# Patient Record
Sex: Female | Born: 1965 | Race: Black or African American | Hispanic: No | Marital: Single | Smoking: Current every day smoker
Health system: Southern US, Community
[De-identification: ages and names within clinical notes are randomized; demographics above are authoritative.]

## PROBLEM LIST (undated history)

## (undated) DIAGNOSIS — O009 Unspecified ectopic pregnancy without intrauterine pregnancy: Secondary | ICD-10-CM

## (undated) DIAGNOSIS — K219 Gastro-esophageal reflux disease without esophagitis: Secondary | ICD-10-CM

## (undated) HISTORY — PX: ECTOPIC PREGNANCY SURGERY: SHX613

---

## 2013-11-08 ENCOUNTER — Encounter (HOSPITAL_BASED_OUTPATIENT_CLINIC_OR_DEPARTMENT_OTHER): Payer: Self-pay | Admitting: Emergency Medicine

## 2013-11-08 ENCOUNTER — Emergency Department (HOSPITAL_BASED_OUTPATIENT_CLINIC_OR_DEPARTMENT_OTHER)
Admission: EM | Admit: 2013-11-08 | Discharge: 2013-11-09 | Disposition: A | Discharge status: Home / Self Care | Payer: No Typology Code available for payment source | Attending: Emergency Medicine | Admitting: Emergency Medicine

## 2013-11-08 DIAGNOSIS — H6690 Otitis media, unspecified, unspecified ear: Secondary | ICD-10-CM | POA: Insufficient documentation

## 2013-11-08 DIAGNOSIS — H109 Unspecified conjunctivitis: Secondary | ICD-10-CM | POA: Insufficient documentation

## 2013-11-08 DIAGNOSIS — H6691 Otitis media, unspecified, right ear: Secondary | ICD-10-CM

## 2013-11-08 DIAGNOSIS — F17200 Nicotine dependence, unspecified, uncomplicated: Secondary | ICD-10-CM | POA: Insufficient documentation

## 2013-11-08 HISTORY — DX: Unspecified ectopic pregnancy without intrauterine pregnancy: O00.90

## 2013-11-08 MED ORDER — TOBRAMYCIN 0.3 % OP SOLN: 2.0000 [drp] | OPHTHALMIC | Status: DC

## 2013-11-08 MED ORDER — AMOXICILLIN 500 MG PO CAPS: 500.0000 mg | ORAL_CAPSULE | Freq: Three times a day (TID) | ORAL | Status: AC

## 2013-11-08 NOTE — Discharge Instructions (Signed)
Conjunctivitis Conjunctivitis is commonly called "pink eye." Conjunctivitis can be caused by bacterial or viral infection, allergies, or injuries. There is usually redness of the lining of the eye, itching, discomfort, and sometimes discharge. There may be deposits of matter along the eyelids. A viral infection usually causes a watery discharge, while a bacterial infection causes a yellowish, thick discharge. Pink eye is very contagious and spreads by direct contact. You may be given antibiotic eyedrops as part of your treatment. Before using your eye medicine, remove all drainage from the eye by washing gently with warm water and cotton balls. Continue to use the medication until you have awakened 2 mornings in a row without discharge from the eye. Do not rub your eye. This increases the irritation and helps spread infection. Use separate towels from other household members. Wash your hands with soap and water before and after touching your eyes. Use cold compresses to reduce pain and sunglasses to relieve irritation from light. Do not wear contact lenses or wear eye makeup until the infection is gone. SEEK MEDICAL CARE IF:   Your symptoms are not better after 3 days of treatment.  You have increased pain or trouble seeing.  The outer eyelids become very red or swollen. Document Released: 06/29/2004 Document Revised: 08/14/2011 Document Reviewed: 05/22/2005 [facility] Patient Information 2014 [place], [place]. Otitis Media, Adult Otitis media is redness, soreness, and swelling (inflammation) of the middle ear. Otitis media may be caused by allergies or, most commonly, by infection. Often it occurs as a complication of the common cold. SIGNS AND SYMPTOMS Symptoms of otitis media may include:  Earache.  Fever.  Ringing in your ear.  Headache.  Leakage of fluid from the ear. DIAGNOSIS To diagnose otitis media, your health care provider will examine your ear with an otoscope. This is an  instrument that allows your health care provider to see into your ear in order to examine your eardrum. Your health care provider also will ask you questions about your symptoms. TREATMENT  Typically, otitis media resolves on its own within 3 5 days. Your health care provider may prescribe medicine to ease your symptoms of pain. If otitis media does not resolve within 5 days or is recurrent, your health care provider may prescribe antibiotic medicines if he or she suspects that a bacterial infection is the cause. HOME CARE INSTRUCTIONS   Take your medicine as directed until it is gone, even if you feel better after the first few days.  Only take over-the-counter or prescription medicines for pain, discomfort, or fever as directed by your health care provider.  Follow up with your health care provider as directed. SEEK MEDICAL CARE IF:  You have otitis media only in one ear or bleeding from your nose or both.  You notice a lump on your neck.  You are not getting better in 3 5 days.  You feel worse instead of better. SEEK IMMEDIATE MEDICAL CARE IF:   You have pain that is not controlled with medicine.  You have swelling, redness, or pain around your ear or stiffness in your neck.  You notice that part of your face is paralyzed.  You notice that the bone behind your ear (mastoid) is tender when you touch it. MAKE SURE YOU:   Understand these instructions.  Will watch your condition.  Will get help right away if you are not doing well or get worse. Document Released: 02/25/2004 Document Revised: 03/12/2013 Document Reviewed: 12/17/2012 [facility] Patient Information 2014 [place], [place].

## 2013-11-08 NOTE — ED Provider Notes (Signed)
CSN: [id number]     Arrival date & time 11/08/13  2309 History   First MD Initiated Contact with Patient 11/08/13 2349     Chief Complaint  Patient presents with  . Eye Drainage     (Consider location/radiation/quality/duration/timing/severity/associated sxs/prior Treatment) Patient is a 48 y.o. female presenting with conjunctivitis. The history is provided by the patient. No language interpreter was used.  Conjunctivitis This is a new problem. Episode onset: 3 days. The problem occurs constantly. The problem has been unchanged. Pertinent negatives include no fever. Nothing aggravates the symptoms. She has tried nothing for the symptoms. The treatment provided no relief.    Past Medical History  Diagnosis Date  . Ectopic pregnancy    History reviewed. No pertinent past surgical history. No family history on file. History  Substance Use Topics  . Smoking status: Current Every Day Smoker  . Smokeless tobacco: Not on file  . Alcohol Use: Yes     Comment: occasdional    OB History   Grav Para Term Preterm Abortions TAB SAB Ect Mult Living                 Review of Systems  Constitutional: Negative for fever.  HENT: Positive for ear pain.   Eyes: Positive for pain and redness.  All other systems reviewed and are negative.     Allergies  Review of patient's allergies indicates no known allergies.  Home Medications   Prior to Admission medications   Not on File   BP 155/94  Pulse 91  Temp(Src) 98.1 F (36.7 C)  Resp 18  Ht 5\' 8"  (1.727 m)  Wt 253 lb (114.76 kg)  BMI 38.48 kg/m2  SpO2 97%  LMP 10/31/2013 Physical Exam  Nursing note and vitals reviewed. Constitutional: She appears well-developed and well-nourished.  HENT:  Head: Normocephalic.  Nose: Nose normal.  Mouth/Throat: Oropharynx is clear and moist.  Right tm erythematous  Eyes: Pupils are equal, round, and reactive to light. Right eye exhibits discharge.  Neck: Normal range of motion.   Cardiovascular: Normal rate and normal heart sounds.   Pulmonary/Chest: Effort normal.  Abdominal: Soft.  Musculoskeletal: Normal range of motion.  Neurological: She is alert.  Skin: Skin is warm.    ED Course  Procedures (including critical care time) Labs Review Labs Reviewed - No data to display  Imaging Review No results found.   EKG Interpretation None      MDM   Final diagnoses:  Otitis media of right ear  Conjunctivitis    tobrex amoxicillian    Elson Areas, PA-C 11/09/13 0000  Elson Areas, PA-C 11/09/13 0000

## 2013-11-08 NOTE — ED Notes (Signed)
Pt with redness to both eyes that started on Thursday. States right is worse than left. States eyes were "matted shut" this morning. Also c/o right ear hurting. Denies any fevers. Both eyes with redness on exam.

## 2013-11-09 NOTE — ED Provider Notes (Signed)
Medical screening examination/treatment/procedure(s) were performed by non-physician practitioner and as supervising physician I was immediately available for consultation/collaboration.   EKG Interpretation None        Hanley Seamen, MD 11/09/13 [phone]

## 2013-11-09 NOTE — ED Notes (Signed)
rx x 2 for tobrex gtts and amoxicillin

## 2016-03-20 ENCOUNTER — Encounter (HOSPITAL_BASED_OUTPATIENT_CLINIC_OR_DEPARTMENT_OTHER): Payer: Self-pay

## 2016-03-20 ENCOUNTER — Emergency Department (HOSPITAL_BASED_OUTPATIENT_CLINIC_OR_DEPARTMENT_OTHER)
Admission: EM | Admit: 2016-03-20 | Discharge: 2016-03-20 | Disposition: A | Discharge status: Home / Self Care | Payer: No Typology Code available for payment source | Attending: Emergency Medicine | Admitting: Emergency Medicine

## 2016-03-20 DIAGNOSIS — F17210 Nicotine dependence, cigarettes, uncomplicated: Secondary | ICD-10-CM | POA: Insufficient documentation

## 2016-03-20 DIAGNOSIS — K047 Periapical abscess without sinus: Secondary | ICD-10-CM | POA: Insufficient documentation

## 2016-03-20 MED ORDER — NAPROXEN 500 MG PO TABS: 500.0000 mg | ORAL_TABLET | Freq: Two times a day (BID) | ORAL | 0 refills | Status: DC

## 2016-03-20 MED ORDER — PENICILLIN V POTASSIUM 500 MG PO TABS: 500.0000 mg | ORAL_TABLET | Freq: Four times a day (QID) | ORAL | 0 refills | Status: DC

## 2016-03-20 NOTE — ED Provider Notes (Signed)
[facility] Provider Note   CSN: [id number] Arrival date & time: 03/20/16  1532  By signing my name below, I, Ashley Potter, attest that this documentation has been prepared under the direction and in the presence of Sharilyn Sites, PA-C. Electronically Signed: Linna Potter, Scribe. 03/20/2016. 4:11 PM.  History   Chief Complaint Chief Complaint  Patient presents with  . Abscess    The history is provided by the patient. No language interpreter was used.     HPI Comments: Ashley Potter is a 50 y.o. female who presents to the Emergency Department complaining of sudden onset, constant, left upper dental abscess beginning 3 days ago. Pt reports the abscess is painful and has gotten bigger since onset. She notes associated swelling and is concerned for infection. Pt endorses pain exacerbation with swallowing. She denies experiencing similar symptoms in the past. Pt further denies fever, chills, trouble swallowing, or any other associated symptoms. Pt does not have a dentist.  Past Medical History:  Diagnosis Date  . Ectopic pregnancy     There are no active problems to display for this patient.   Past Surgical History:  Procedure Laterality Date  . ECTOPIC PREGNANCY SURGERY      OB History    No data available       Home Medications    Prior to Admission medications   Medication Sig Start Date End Date Taking? Authorizing Provider  naproxen (NAPROSYN) 500 MG tablet Take 1 tablet (500 mg total) by mouth 2 (two) times daily with a meal. 03/20/16   Garlon Hatchet, PA-C  penicillin v potassium (VEETID) 500 MG tablet Take 1 tablet (500 mg total) by mouth 4 (four) times daily. 03/20/16   Garlon Hatchet, PA-C    Family History No family history on file.  Social History Social History  Substance Use Topics  . Smoking status: Current Every Day Smoker    Types: Cigarettes  . Smokeless tobacco: Never Used  . Alcohol use Yes     Comment: weekly      Allergies   Review of patient's allergies indicates no known allergies.   Review of Systems Review of Systems  Constitutional: Negative for chills and fever.  HENT: Positive for dental problem (left upper dental abscess). Negative for facial swelling and trouble swallowing.   All other systems reviewed and are negative.   Physical Exam Updated Vital Signs BP 141/86 (BP Location: Left Arm)   Pulse 96   Temp 98.6 F (37 C) (Oral)   Resp 18   Ht 5\' 7"  (1.702 m)   Wt 234 lb (106.1 kg)   SpO2 98%   BMI 36.65 kg/m   Physical Exam  Constitutional: She is oriented to person, place, and time. She appears well-developed and well-nourished.  HENT:  Head: Normocephalic and atraumatic.  Mouth/Throat: Oropharynx is clear and moist.  Teeth largely in fair dentition, left upper first molar with cavity present, surrounding gingiva swollen and appears irritated, handling secretions appropriately, no trismus, no facial or neck swelling, normal phonation without stridor  Eyes: Conjunctivae and EOM are normal. Pupils are equal, round, and reactive to light.  Neck: Normal range of motion.  Cardiovascular: Normal rate, regular rhythm and normal heart sounds.   Pulmonary/Chest: Effort normal and breath sounds normal.  Abdominal: Soft. Bowel sounds are normal.  Musculoskeletal: Normal range of motion.  Neurological: She is alert and oriented to person, place, and time.  Skin: Skin is warm and dry.  Psychiatric: She has a  normal mood and affect.  Nursing note and vitals reviewed.   ED Treatments / Results  Labs (all labs ordered are listed, but only abnormal results are displayed) Labs Reviewed - No data to display  EKG  EKG Interpretation None       Radiology No results found.  Procedures Procedures (including critical care time)  DIAGNOSTIC STUDIES: Oxygen Saturation is 98% on RA, normal by my interpretation.    COORDINATION OF CARE: 4:13 PM Discussed treatment plan  with pt at bedside and pt agreed to plan.  Medications Ordered in ED Medications - No data to display   Initial Impression / Assessment and Plan / ED Course  I have reviewed the triage vital signs and the nursing notes.  Pertinent labs & imaging results that were available during my care of the patient were reviewed by me and considered in my medical decision making (see chart for details).  Clinical Course   50 y.o. F here with left upper dental pain.  She is afebrile, non-toxic.  Left upper gums swollen surrounding the left upper first molar. There is no drainable fluid collection currently. No facial or neck swelling. Handling secretions well. Not clinically concerning for Ludwig's angina. Likely developing abscess. Will discharge home on antibiotics, dental follow-up recommended.  Discussed plan with patient, she acknowledged understanding and agreed with plan of care.  Return precautions given for new or worsening symptoms.  I personally performed the services described in this documentation, which was scribed in my presence. The recorded information has been reviewed and is accurate.  Final Clinical Impressions(s) / ED Diagnoses   Final diagnoses:  Dental abscess    New Prescriptions New Prescriptions   NAPROXEN (NAPROSYN) 500 MG TABLET    Take 1 tablet (500 mg total) by mouth 2 (two) times daily with a meal.   PENICILLIN V POTASSIUM (VEETID) 500 MG TABLET    Take 1 tablet (500 mg total) by mouth 4 (four) times daily.     Garlon HatchetLisa M Ilya Neely, PA-C 03/20/16 1617    Lavera Guiseana Duo Liu, MD 03/21/16 2101

## 2016-03-20 NOTE — ED Triage Notes (Signed)
C/o "abscess" to left upper gum x 4 days-NAD-steady gait

## 2016-03-20 NOTE — Discharge Instructions (Signed)
Take the prescribed medication as directed. Follow-up with dentist.  Dr. Russella DarBenitez is on cal-- you may see him or other clinic in the area. Return to the ED for new or worsening symptoms.

## 2017-04-25 ENCOUNTER — Other Ambulatory Visit: Payer: Self-pay

## 2017-04-25 ENCOUNTER — Emergency Department (HOSPITAL_BASED_OUTPATIENT_CLINIC_OR_DEPARTMENT_OTHER)
Admission: EM | Admit: 2017-04-25 | Discharge: 2017-04-25 | Disposition: A | Discharge status: Home / Self Care | Payer: BLUE CROSS/BLUE SHIELD | Attending: Emergency Medicine | Admitting: Emergency Medicine

## 2017-04-25 ENCOUNTER — Emergency Department (HOSPITAL_BASED_OUTPATIENT_CLINIC_OR_DEPARTMENT_OTHER): Payer: BLUE CROSS/BLUE SHIELD

## 2017-04-25 DIAGNOSIS — F17210 Nicotine dependence, cigarettes, uncomplicated: Secondary | ICD-10-CM | POA: Insufficient documentation

## 2017-04-25 DIAGNOSIS — R0789 Other chest pain: Secondary | ICD-10-CM | POA: Diagnosis not present

## 2017-04-25 DIAGNOSIS — Z79899 Other long term (current) drug therapy: Secondary | ICD-10-CM | POA: Insufficient documentation

## 2017-04-25 DIAGNOSIS — R079 Chest pain, unspecified: Secondary | ICD-10-CM | POA: Diagnosis present

## 2017-04-25 LAB — CBC WITH DIFFERENTIAL/PLATELET
Basophils Absolute: 0 10*3/uL (ref 0.0–0.1)
Basophils Relative: 0 %
Eosinophils Absolute: 0.1 10*3/uL (ref 0.0–0.7)
Eosinophils Relative: 2 %
HCT: 36.1 % (ref 36.0–46.0)
Hemoglobin: 12.3 g/dL (ref 12.0–15.0)
Lymphocytes Relative: 32 %
Lymphs Abs: 2 10*3/uL (ref 0.7–4.0)
MCH: 30.3 pg (ref 26.0–34.0)
MCHC: 34.1 g/dL (ref 30.0–36.0)
MCV: 88.9 fL (ref 78.0–100.0)
Monocytes Absolute: 0.4 10*3/uL (ref 0.1–1.0)
Monocytes Relative: 7 %
Neutro Abs: 3.7 10*3/uL (ref 1.7–7.7)
Neutrophils Relative %: 59 %
Platelets: 279 10*3/uL (ref 150–400)
RBC: 4.06 MIL/uL (ref 3.87–5.11)
RDW: 15.9 % — ABNORMAL HIGH (ref 11.5–15.5)
WBC: 6.2 10*3/uL (ref 4.0–10.5)

## 2017-04-25 LAB — LIPASE, BLOOD: Lipase: 29 U/L (ref 11–51)

## 2017-04-25 LAB — COMPREHENSIVE METABOLIC PANEL
ALT: 17 U/L (ref 14–54)
AST: 21 U/L (ref 15–41)
Albumin: 3.4 g/dL — ABNORMAL LOW (ref 3.5–5.0)
Alkaline Phosphatase: 66 U/L (ref 38–126)
Anion gap: 7 (ref 5–15)
BUN: 18 mg/dL (ref 6–20)
CO2: 25 mmol/L (ref 22–32)
Calcium: 9 mg/dL (ref 8.9–10.3)
Chloride: 106 mmol/L (ref 101–111)
Creatinine, Ser: 0.68 mg/dL (ref 0.44–1.00)
GFR calc Af Amer: >60 mL/min (ref >60)
GFR calc non Af Amer: >60 mL/min (ref >60)
Glucose, Bld: 133 mg/dL — ABNORMAL HIGH (ref 65–99)
Potassium: 3.7 mmol/L (ref 3.5–5.1)
Sodium: 138 mmol/L (ref 135–145)
Total Bilirubin: 0.1 mg/dL — ABNORMAL LOW (ref 0.3–1.2)
Total Protein: 7.2 g/dL (ref 6.5–8.1)

## 2017-04-25 LAB — TROPONIN I
Troponin I: <0.03 ng/mL (ref <0.03)
Troponin I: <0.03 ng/mL (ref <0.03)

## 2017-04-25 MED ORDER — OMEPRAZOLE 20 MG PO CPDR: 20.0000 mg | DELAYED_RELEASE_CAPSULE | Freq: Two times a day (BID) | ORAL | 0 refills | Status: DC

## 2017-04-25 MED ORDER — GI COCKTAIL ~~LOC~~
30.0000 mL | Freq: Once | ORAL | Status: AC
  Administered 2017-04-25: 30 mL via ORAL
  Filled 2017-04-25: qty 30

## 2017-04-25 MED ORDER — SUCRALFATE 1 G PO TABS: 1.0000 g | ORAL_TABLET | Freq: Three times a day (TID) | ORAL | 0 refills | Status: DC

## 2017-04-25 MED ORDER — SUCRALFATE 1 G PO TABS
1.0000 g | ORAL_TABLET | Freq: Once | ORAL | Status: AC
  Administered 2017-04-25: 1 g via ORAL
  Filled 2017-04-25: qty 1

## 2017-04-25 MED ORDER — FAMOTIDINE IN NACL 20-0.9 MG/50ML-% IV SOLN
20.0000 mg | Freq: Once | INTRAVENOUS | Status: AC
  Administered 2017-04-25: 20 mg via INTRAVENOUS
  Filled 2017-04-25: qty 50

## 2017-04-25 MED FILL — SUCRALFATE 1 GM TABLET: 1 | 14 days supply | Qty: 56 | Fill #0

## 2017-04-25 MED FILL — OMEPRAZOLE 20 MG CAP: 20 | 14 days supply | Qty: 28 | Fill #0

## 2017-04-25 NOTE — ED Provider Notes (Signed)
[facility] EMERGENCY DEPARTMENT Provider Note   CSN: [id number] Arrival date & time: 04/25/17  [id number]     History   Chief Complaint Chief Complaint  Patient presents with  . Chest Pain    HPI Ashley Potter is a 51 y.o. female who presents today with chief complaint acute onset, progressively worsening chest pain.  She states that for the past 2-3 weeks she has been experiencing intermittent chest pain which she describes as "something getting stuck in the middle of my chest " which did not radiate.  She states that this pain occurs primarily only after meals and resolves with time.  She also endorses intermittent numbness and tingling of the hands during this time. She states that at around 9 AM this morning she began developing constant substernal chest pressure with intermittent radiation towards the left.  She denies lightheadedness, nausea, vomiting, shortness of breath, or syncope.  She denies abdominal pain, fevers, chills.  She denies recent travel or surgeries, hemoptysis, prior history of DVT or PE, or OCP or estrogen use.  She has not tried anything for her symptoms but states they are progressively improving.  She is a smoker currently.  She states that she drinks approximately 4 beers 3 times weekly.  She denies recreational drug use.  She does not have a primary care physician.  States that the last time she had similar symptoms she was seen in an emergency department and discharged with naproxen and Robaxin.  Denies leg swelling or palpitations.  The history is provided by the patient.    Past Medical History:  Diagnosis Date  . Ectopic pregnancy     There are no active problems to display for this patient.   Past Surgical History:  Procedure Laterality Date  . ECTOPIC PREGNANCY SURGERY      OB History    No data available       Home Medications    Prior to Admission medications   Medication Sig Start Date End Date Taking? Authorizing Provider    naproxen (NAPROSYN) 500 MG tablet Take 1 tablet (500 mg total) by mouth 2 (two) times daily with a meal. 03/20/16   Allyne GeeSanders, Rosezella FloridaLisa M, PA-C  omeprazole (PRILOSEC) 20 MG capsule Take 1 capsule (20 mg total) by mouth 2 (two) times daily before a meal for 14 days. 04/25/17 05/09/17  Michela PitcherFawze, Cutter Passey A, PA-C  penicillin v potassium (VEETID) 500 MG tablet Take 1 tablet (500 mg total) by mouth 4 (four) times daily. 03/20/16   Garlon HatchetSanders, Lisa M, PA-C  sucralfate (CARAFATE) 1 g tablet Take 1 tablet (1 g total) by mouth 4 (four) times daily -  with meals and at bedtime for 14 days. 04/25/17 05/09/17  Jeanie SewerFawze, Loys Hoselton A, PA-C    Family History No family history on file.  Social History Social History   Tobacco Use  . Smoking status: Current Every Day Smoker    Types: Cigarettes  . Smokeless tobacco: Never Used  Substance Use Topics  . Alcohol use: Yes    Comment: weekly  . Drug use: No     Allergies   Patient has no known allergies.   Review of Systems Review of Systems  Constitutional: Negative for chills and fever.  Respiratory: Negative for cough and shortness of breath.   Cardiovascular: Positive for chest pain. Negative for palpitations and leg swelling.  Gastrointestinal: Negative for abdominal pain, diarrhea, nausea and vomiting.  Neurological: Negative for numbness.  All other systems reviewed and are negative.  Physical Exam Updated Vital Signs BP 138/87 (BP Location: Right Wrist)   Pulse 73   Temp 98.3 F (36.8 C) (Oral)   Resp (!) 27   LMP  (LMP Unknown)   SpO2 94%   Physical Exam  Constitutional: She appears well-developed and well-nourished. No distress.  HENT:  Head: Normocephalic and atraumatic.  Eyes: Conjunctivae are normal. Right eye exhibits no discharge. Left eye exhibits no discharge.  Neck: Normal range of motion. Neck supple. No JVD present. No tracheal deviation present.  Cardiovascular: Normal rate, regular rhythm and normal pulses. Exam reveals no distant  heart sounds.  No murmur heard. Pulses:      Radial pulses are 2+ on the right side, and 2+ on the left side.       Dorsalis pedis pulses are 2+ on the right side, and 2+ on the left side.       Posterior tibial pulses are 2+ on the right side, and 2+ on the left side.  Pulmonary/Chest: Effort normal and breath sounds normal. No accessory muscle usage. No tachypnea.  Chest wall nontender to palpation  Abdominal: Soft. Bowel sounds are normal. She exhibits no distension. There is no tenderness.  Murphy sign absent, Rovsing absent, no CVA tenderness  Musculoskeletal: She exhibits no edema.       Right lower leg: She exhibits no tenderness and no edema.       Left lower leg: She exhibits no tenderness and no edema.  Neurological: She is alert.  Skin: Skin is warm and dry. No erythema.  Psychiatric: She has a normal mood and affect. Her behavior is normal.  Nursing note and vitals reviewed.    ED Treatments / Results  Labs (all labs ordered are listed, but only abnormal results are displayed) Labs Reviewed  COMPREHENSIVE METABOLIC PANEL - Abnormal; Notable for the following components:      Result Value   Glucose, Bld 133 (*)    Albumin 3.4 (*)    Total Bilirubin 0.1 (*)    All other components within normal limits  CBC WITH DIFFERENTIAL/PLATELET - Abnormal; Notable for the following components:   RDW 15.9 (*)    All other components within normal limits  TROPONIN I  LIPASE, BLOOD  TROPONIN I    EKG  EKG Interpretation  Date/Time:  Wednesday April 25 2017 10:04:52 EST Ventricular Rate:  99 PR Interval:    QRS Duration: 88 QT Interval:  363 QTC Calculation: 466 R Axis:   11 Text Interpretation:  Sinus rhythm Consider anterior infarct No previous ECGs available Confirmed by Richardean Canal [phone]) on 04/25/2017 10:07:48 AM       Radiology Dg Chest 2 View  Result Date: 04/25/2017 CLINICAL DATA:  Central chest pain. EXAM: CHEST  2 VIEW COMPARISON:  None. FINDINGS: The  lungs are clear without focal pneumonia, edema, pneumothorax or pleural effusion. The cardiopericardial silhouette is within normal limits for size. The visualized bony structures of the thorax are intact. Telemetry leads overlie the chest. IMPRESSION: No active cardiopulmonary disease. Electronically Signed   By: Kennith Center M.D.   On: 04/25/2017 10:47    Procedures Procedures (including critical care time)  Medications Ordered in ED Medications  gi cocktail (Maalox,Lidocaine,Donnatal) (30 mLs Oral Given 04/25/17 1108)  sucralfate (CARAFATE) tablet 1 g (1 g Oral Given 04/25/17 1230)  famotidine (PEPCID) IVPB 20 mg premix (0 mg Intravenous Stopped 04/25/17 1321)     Initial Impression / Assessment and Plan / ED Course  I have  reviewed the triage vital signs and the nursing notes.  Pertinent labs & imaging results that were available during my care of the patient were reviewed by me and considered in my medical decision making (see chart for details).     Patient with intermittent chest pain which began 2 weeks ago with acute worsening this morning.  Afebrile, somewhat tachypneic while in the ED, however she denies shortness of breath and SPO2 saturations are within normal limits.  She does endorse anxiety which may be contributing to her respiratory rate.  Vital signs otherwise stable.  She is PERC negative and I have a low suspicion of DVT or PE.  Serial troponins negative, EKG without evidence of ST segment abnormality or arrhythmia.  She has a HEART score of 3 and therefore low risk for ACS or MI.  Chest x-ray shows no acute cardiopulmonary abnormalities such as pneumonia or pleural effusion.  No evidence of pericarditis or myocarditis or bronchitis.  Remainder of lab work is unremarkable.  History and physical examination suggestive of GERD/PUD, as her symptoms worsen after eating and she had improvement in her symptoms after administration of a GI cocktail, Pepcid, and Carafate.  No  further emergent workup required at this time.  She is stable for discharge home with follow-up with a primary care physician for reevaluation of her symptoms.  Will discharge with Prilosec and Carafate.  Advised of foods that may aggravate her symptoms.  Discussed indications for return to the ED. Pt verbalized understanding of and agreement with plan and is safe for discharge home at this time.  She has no complaints prior to discharge.  Final Clinical Impressions(s) / ED Diagnoses   Final diagnoses:  Atypical chest pain    ED Discharge Orders        Ordered    omeprazole (PRILOSEC) 20 MG capsule  2 times daily before meals     04/25/17 1323    sucralfate (CARAFATE) 1 g tablet  3 times daily with meals & bedtime     04/25/17 1323       Jeanie SewerFawze, Arlen Dupuis A, PA-C 04/25/17 1649    Charlynne PanderYao, David Hsienta, MD 04/26/17 1000

## 2017-04-25 NOTE — ED Triage Notes (Addendum)
Presents with intermittent chest burning that began two weeks ago and is worse with eating on the right side. Today the pain changed to a pressure and has been constant since this AM associated with right shoulder sharp pain and pain into the right elbow. The pain is worse with food and not made better by anything. Endorses feeling anxious.  Denies SOB and nausea, denies dizziness. She had the same pain before and repriots that is was not anything that was found at that time. She also reports that her right hand has been going numb off and on for two weeks.

## 2017-04-25 NOTE — Discharge Instructions (Signed)
Take medications as prescribed.  I have also attached information about foods to avoid that may help your symptoms.  Follow-up with a primary care physician for reevaluation of your symptoms.  Return to the ED immediately if any concerning signs or symptoms develop.

## 2017-04-25 NOTE — ED Notes (Signed)
ED Provider at bedside. 

## 2017-08-27 ENCOUNTER — Other Ambulatory Visit: Payer: Self-pay

## 2017-08-27 ENCOUNTER — Encounter (HOSPITAL_BASED_OUTPATIENT_CLINIC_OR_DEPARTMENT_OTHER): Payer: Self-pay | Admitting: Emergency Medicine

## 2017-08-27 ENCOUNTER — Emergency Department (HOSPITAL_BASED_OUTPATIENT_CLINIC_OR_DEPARTMENT_OTHER)
Admission: EM | Admit: 2017-08-27 | Discharge: 2017-08-27 | Disposition: A | Discharge status: Home / Self Care | Payer: BLUE CROSS/BLUE SHIELD | Attending: Emergency Medicine | Admitting: Emergency Medicine

## 2017-08-27 DIAGNOSIS — Z79899 Other long term (current) drug therapy: Secondary | ICD-10-CM | POA: Diagnosis not present

## 2017-08-27 DIAGNOSIS — F17210 Nicotine dependence, cigarettes, uncomplicated: Secondary | ICD-10-CM | POA: Insufficient documentation

## 2017-08-27 DIAGNOSIS — J029 Acute pharyngitis, unspecified: Secondary | ICD-10-CM | POA: Diagnosis present

## 2017-08-27 DIAGNOSIS — B349 Viral infection, unspecified: Secondary | ICD-10-CM | POA: Insufficient documentation

## 2017-08-27 LAB — RAPID STREP SCREEN (MED CTR MEBANE ONLY): STREPTOCOCCUS, GROUP A SCREEN (DIRECT): NEGATIVE

## 2017-08-27 MED ORDER — IBUPROFEN 800 MG PO TABS: 800.0000 mg | ORAL_TABLET | Freq: Three times a day (TID) | ORAL | 0 refills | Status: AC | PRN

## 2017-08-27 MED ORDER — FLUTICASONE PROPIONATE 50 MCG/ACT NA SUSP: 1.0000 | Freq: Every day | NASAL | 2 refills | Status: DC

## 2017-08-27 MED ORDER — BENZONATATE 100 MG PO CAPS: 100.0000 mg | ORAL_CAPSULE | Freq: Three times a day (TID) | ORAL | 0 refills | Status: DC | PRN

## 2017-08-27 NOTE — Discharge Instructions (Addendum)
You were seen in the emergency today and diagnosed with a viral illness.  Your rapid strep test was negative, we will call you if the culture comes back positive.  I have prescribed you multiple medications to treat your symptoms.   -Flonase to be used 1 spray in each nostril daily.  This medication is used to treat your congestion.  -Tessalon can be taken once every 8 hours as needed.  This medication is used to treat your cough.  -Ibuprofen to be taken once every 8 hours as needed for pain.  We have prescribed you new medication(s) today. Discuss the medications prescribed today with your pharmacist as they can have adverse effects and interactions with your other medicines including over the counter and prescribed medications. Seek medical evaluation if you start to experience new or abnormal symptoms after taking one of these medicines, seek care immediately if you start to experience difficulty breathing, feeling of your throat closing, facial swelling, or rash as these could be indications of a more serious allergic reaction   You will need to follow-up with your primary care provider in 1 week if your symptoms have not improved.  If you do not have a primary care provider one is provided in your discharge instructions.  Return to the emergency department for any new or worsening symptoms including but not limited to persistent fever for 5 days, difficulty breathing, chest pain, or passing out.   Additional information:  Your vital signs today were: BP (!) 151/81 (BP Location: Left Arm)    Pulse 84    Temp 99.3 F (37.4 C) (Oral)    Resp 20    Ht 5' 7.5" (1.715 m)    Wt 113.4 kg (250 lb)    SpO2 98%    BMI 38.58 kg/m  If your blood pressure (BP) was elevated above 135/85 this visit, please have this repeated by your doctor within one month. ---------------

## 2017-08-27 NOTE — ED Triage Notes (Signed)
Patient reports headache, sore throat since Friday.

## 2017-08-27 NOTE — ED Provider Notes (Signed)
[facility] EMERGENCY DEPARTMENT Provider Note   CSN: [id number] Arrival date & time: 08/27/17  1555     History   Chief Complaint Chief Complaint  Patient presents with  . Sore Throat    HPI [facility] is a 52 y.o. female with history of tobacco abuse who presents the emergency department complaining of URI symptoms over the past 4 days.  Patient states she is explaining experiencing congestion, rhinorrhea, sore throat, ear pressure bilaterally, and minimal dry cough.  She reports she is having sinus headaches, these are gradual onset, steady progression, similar to previous.  States that it is a sinus pressure type discomfort.  Patient has not tried at home intervention.  There are no alleviating or aggravating factors to her symptoms.  Denies fever, shortness of breath, chest pain, abdominal pain, change in vision, numbness, weakness, or dizziness.  HPI  Past Medical History:  Diagnosis Date  . Ectopic pregnancy     There are no active problems to display for this patient.   Past Surgical History:  Procedure Laterality Date  . ECTOPIC PREGNANCY SURGERY       OB History   None      Home Medications    Prior to Admission medications   Medication Sig Start Date End Date Taking? Authorizing Provider  naproxen (NAPROSYN) 500 MG tablet Take 1 tablet (500 mg total) by mouth 2 (two) times daily with a meal. 03/20/16   Allyne GeeSanders, Rosezella FloridaLisa M, PA-C  omeprazole (PRILOSEC) 20 MG capsule Take 1 capsule (20 mg total) by mouth 2 (two) times daily before a meal for 14 days. 04/25/17 05/09/17  Michela PitcherFawze, Mina A, PA-C  penicillin v potassium (VEETID) 500 MG tablet Take 1 tablet (500 mg total) by mouth 4 (four) times daily. 03/20/16   Garlon HatchetSanders, Lisa M, PA-C  sucralfate (CARAFATE) 1 g tablet Take 1 tablet (1 g total) by mouth 4 (four) times daily -  with meals and at bedtime for 14 days. 04/25/17 05/09/17  Jeanie SewerFawze, Mina A, PA-C    Family History History reviewed. No pertinent family  history.  Social History Social History   Tobacco Use  . Smoking status: Current Every Day Smoker    Types: Cigarettes  . Smokeless tobacco: Never Used  Substance Use Topics  . Alcohol use: Yes    Comment: weekly  . Drug use: No     Allergies   Patient has no known allergies.   Review of Systems Review of Systems  Constitutional: Negative for chills and fever.  HENT: Positive for congestion, ear pain, sinus pressure and sore throat. Negative for trouble swallowing.   Eyes: Negative for visual disturbance.  Respiratory: Positive for cough. Negative for shortness of breath.   Cardiovascular: Negative for chest pain.  Gastrointestinal: Negative for abdominal pain.  Neurological: Positive for headaches. Negative for dizziness, weakness and numbness.     Physical Exam Updated Vital Signs BP (!) 151/81 (BP Location: Left Arm)   Pulse 84   Temp 99.3 F (37.4 C) (Oral)   Resp 20   Ht 5' 7.5" (1.715 m)   Wt 113.4 kg (250 lb)   SpO2 98%   BMI 38.58 kg/m   Physical Exam  Constitutional: She appears well-developed and well-nourished. No distress.  HENT:  Head: Normocephalic and atraumatic.  Right Ear: Tympanic membrane is not perforated, not erythematous, not retracted and not bulging.  Left Ear: Tympanic membrane is not perforated, not erythematous, not retracted and not bulging.  Nose: Mucosal edema (congestion) present.  Right sinus exhibits no maxillary sinus tenderness and no frontal sinus tenderness. Left sinus exhibits no maxillary sinus tenderness and no frontal sinus tenderness.  Mouth/Throat: Uvula is midline and oropharynx is clear and moist. No oropharyngeal exudate or posterior oropharyngeal erythema.  Patient is tolerating her own secretions without difficulty, no trismus, no drooling, no hot potato voice, submandibular compartment is soft.  Eyes: Pupils are equal, round, and reactive to light. Conjunctivae and EOM are normal. Right eye exhibits no discharge.  Left eye exhibits no discharge.  Neck: Normal range of motion. Neck supple.  Cardiovascular: Normal rate and regular rhythm.  No murmur heard. Pulmonary/Chest: Effort normal and breath sounds normal. No respiratory distress. She has no wheezes. She has no rhonchi. She has no rales.  Abdominal: Soft. She exhibits no distension. There is no tenderness.  Lymphadenopathy:    She has no cervical adenopathy.  Neurological: She is alert.  Clear speech.  CN III through XII grossly intact.  No facial droop.  Sensation grossly intact to bilateral upper and lower extremities.  5 out of 5 grip strength bilaterally.  Normal finger to nose bilaterally. steady gait.  Skin: Skin is warm and dry. No rash noted.  Psychiatric: She has a normal mood and affect. Her behavior is normal.  Nursing note and vitals reviewed.   ED Treatments / Results  Labs Results for orders placed or performed during the hospital encounter of 08/27/17  Rapid strep screen  Result Value Ref Range   Streptococcus, Group A Screen (Direct) NEGATIVE NEGATIVE   No results found. EKG None  Radiology No results found.  Procedures Procedures (including critical care time)  Medications Ordered in ED Medications - No data to display   Initial Impression / Assessment and Plan / ED Course  I have reviewed the triage vital signs and the nursing notes.  Pertinent labs & imaging results that were available during my care of the patient were reviewed by me and considered in my medical decision making (see chart for details).   Patient presents with URI symptoms.  Patient is nontoxic-appearing, no apparent distress, vitals WNL with the exception of elevated blood pressure, no indication of hypertensive emergency, discussed with patient need for recheck.  Patient is afebrile, she has no signs of respiratory distress, lungs CTA, doubt pneumonia.  Patient symptoms have been ongoing for 4 days, no sinus tenderness to palpation, no fever,  doubt acute bacterial sinusitis.  Rapid strep test ordered per triage negative, culture pending, doubt strep pharyngitis. Patient with report of headaches- states these are sinus headaches- Patient has hx of similar headaches, gradual onset with steady progression in severity- non concerning for [facility], ICH, ischemic CVA, acute glaucoma, giant cell arteritis, mass, or meningitis. Pt is afebrile with no focal neuro deficits, dizziness, change in vision, or nuchal rigidity.  Suspect viral etiology, will treat supportively with Flonase, ibuprofen, and Tessalon. I discussed results, treatment plan, need for PCP follow-up, and return precautions with the patient. Provided opportunity for questions, patient confirmed understanding and is in agreement with plan.   Final Clinical Impressions(s) / ED Diagnoses   Final diagnoses:  Viral illness    ED Discharge Orders        Ordered    ibuprofen (ADVIL,MOTRIN) 800 MG tablet  Every 8 hours PRN     08/27/17 1922    fluticasone (FLONASE) 50 MCG/ACT nasal spray  Daily     08/27/17 1922    benzonatate (TESSALON) 100 MG capsule  3 times daily PRN  08/27/17 [address], [place], PA-C 08/27/17 Ninfa Linden    Rolland Porter, MD 08/28/17 0030

## 2017-08-27 NOTE — ED Notes (Signed)
NAD at this time. Pt is stable and going home.  

## 2017-08-30 LAB — CULTURE, GROUP A STREP (THRC)

## 2019-06-11 IMAGING — DX DG CHEST 2V
2 series · 2 of 2 positions shown · non-contrast
Comparison: None.

CLINICAL DATA: Central chest pain.

EXAM:
CHEST  2 VIEW

[chest pa]
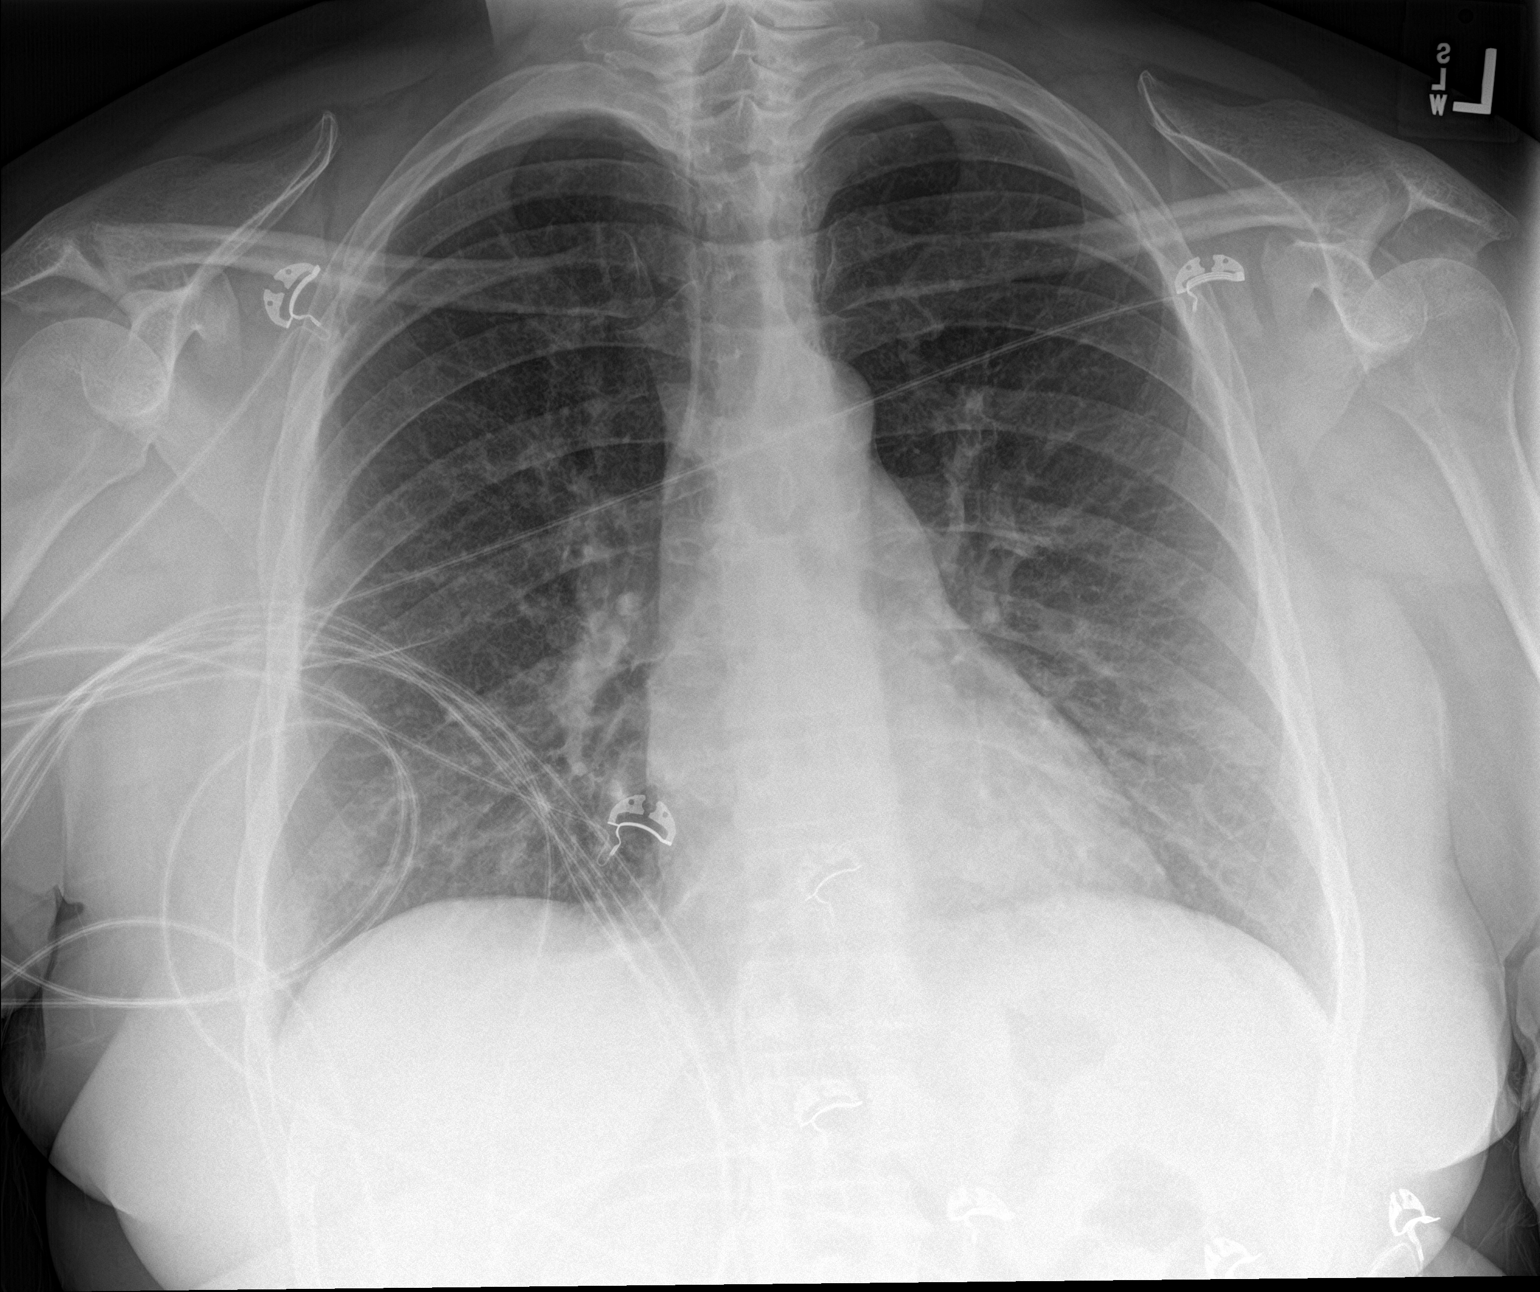

[chest lat]
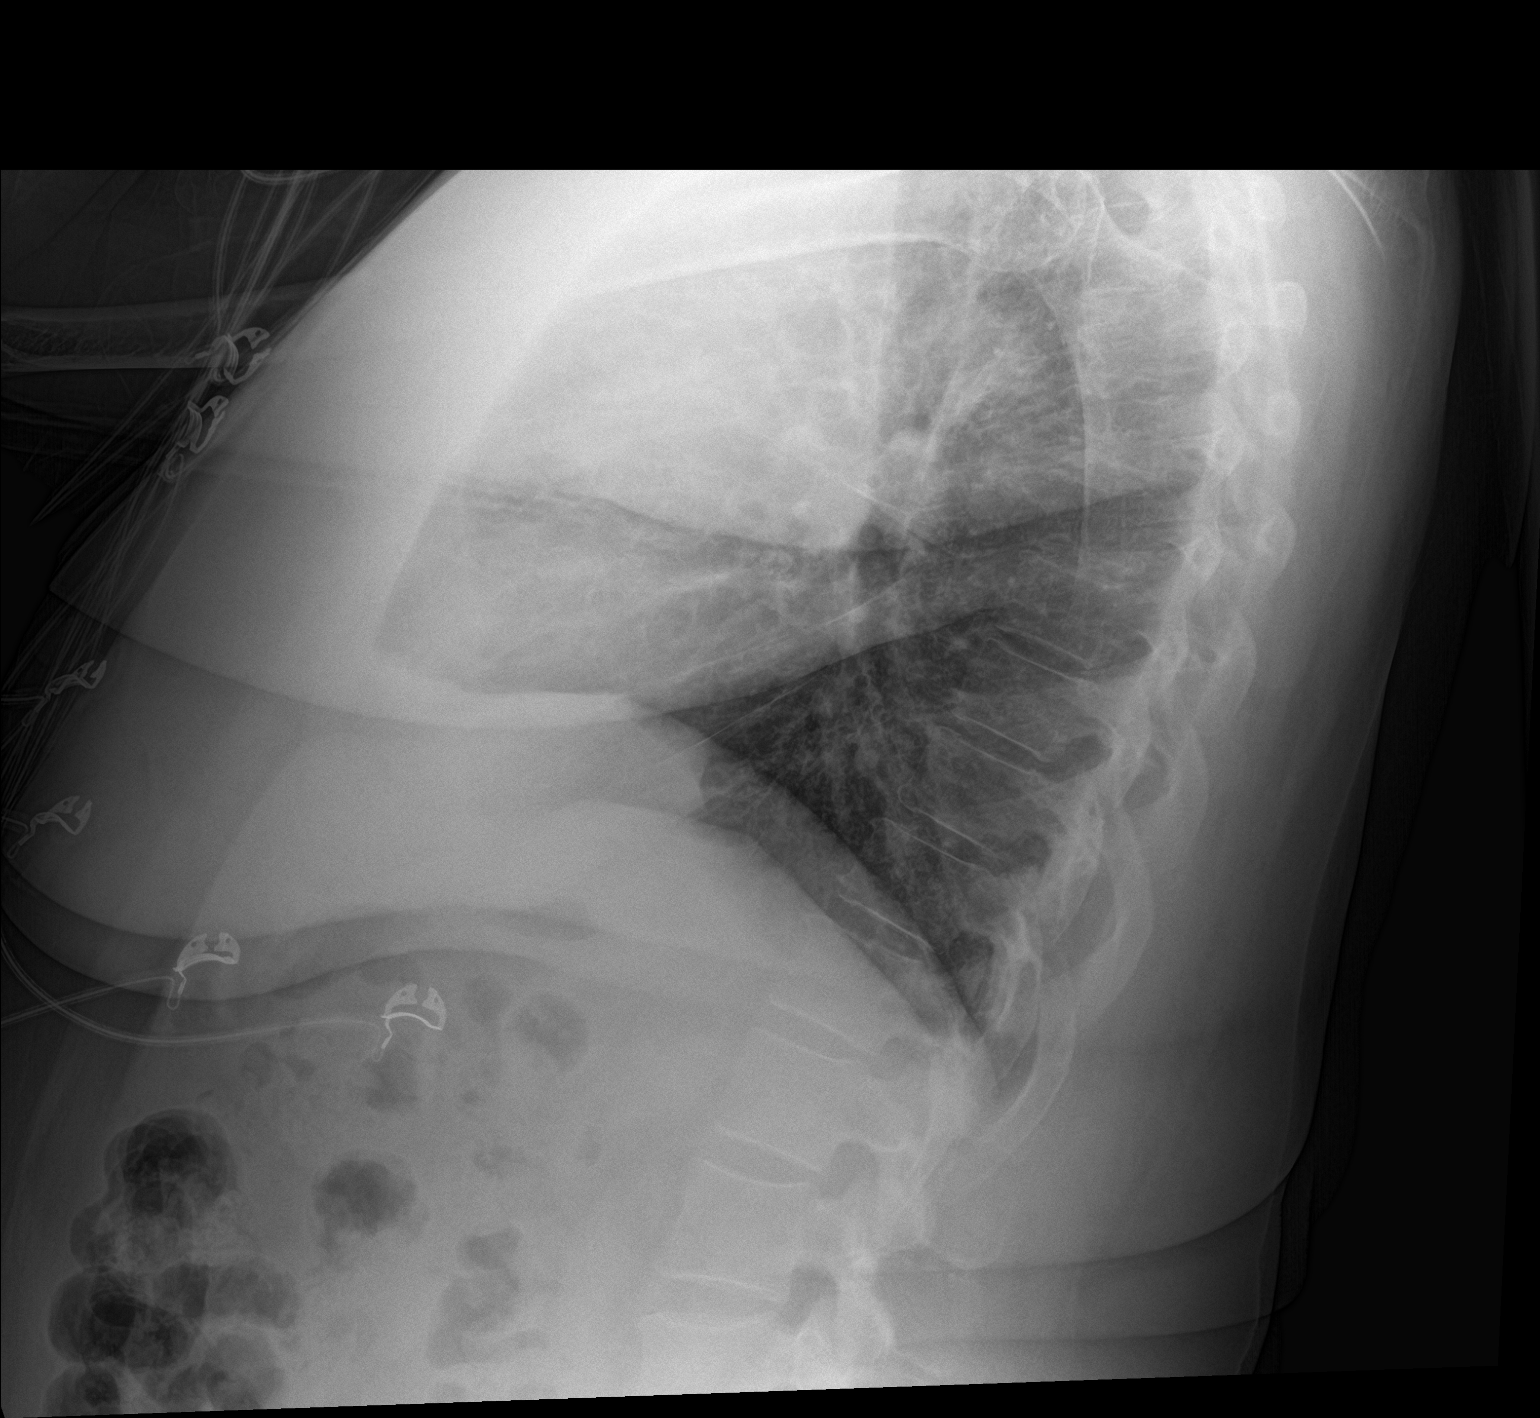

[2 of 2 positions shown; findings below may reference images not displayed]

FINDINGS: The lungs are clear without focal pneumonia, edema, pneumothorax or
pleural effusion. The cardiopericardial silhouette is within normal
limits for size. The visualized bony structures of the thorax are
intact. Telemetry leads overlie the chest.
IMPRESSION: No active cardiopulmonary disease.

## 2022-06-17 ENCOUNTER — Encounter (HOSPITAL_BASED_OUTPATIENT_CLINIC_OR_DEPARTMENT_OTHER): Payer: Self-pay | Admitting: Emergency Medicine

## 2022-06-17 ENCOUNTER — Emergency Department (HOSPITAL_BASED_OUTPATIENT_CLINIC_OR_DEPARTMENT_OTHER)
Admission: EM | Admit: 2022-06-17 | Discharge: 2022-06-17 | Disposition: A | Discharge status: Home / Self Care | Payer: BLUE CROSS/BLUE SHIELD | Attending: Emergency Medicine | Admitting: Emergency Medicine

## 2022-06-17 ENCOUNTER — Emergency Department (HOSPITAL_BASED_OUTPATIENT_CLINIC_OR_DEPARTMENT_OTHER): Payer: BLUE CROSS/BLUE SHIELD

## 2022-06-17 ENCOUNTER — Other Ambulatory Visit: Payer: Self-pay

## 2022-06-17 DIAGNOSIS — M79671 Pain in right foot: Secondary | ICD-10-CM | POA: Insufficient documentation

## 2022-06-17 MED ORDER — CEPHALEXIN 500 MG PO CAPS: 500.0000 mg | ORAL_CAPSULE | Freq: Three times a day (TID) | ORAL | 0 refills | Status: AC

## 2022-06-17 NOTE — ED Provider Notes (Signed)
[facility] Provider Note   CSN: [id number] Arrival date & time: 06/17/22  5631     History  Chief Complaint  Patient presents with   Foot Pain    Ash[facility] is a 57 y.o. female.  Here with pain to the top of the right foot for the last couple days.  Some mild swelling.  History of acid reflux.  Nothing makes it worse or better.  Denies any obvious trauma.  Denies any fevers or chills.  Denies any gout.  Denies any diabetes or other infectious processes.  The history is provided by the patient.       Home Medications Prior to Admission medications   Medication Sig Start Date End Date Taking? Authorizing Provider  cephALEXin (KEFLEX) 500 MG capsule Take 1 capsule (500 mg total) by mouth 3 (three) times daily for 7 days. 06/17/22 06/24/22 Yes Tonna Palazzi, DO  benzonatate (TESSALON) 100 MG capsule Take 1 capsule (100 mg total) by mouth 3 (three) times daily as needed for cough. 08/27/17   Petrucelli, Samantha R, PA-C  fluticasone (FLONASE) 50 MCG/ACT nasal spray Place 1 spray into both nostrils daily. 08/27/17   Petrucelli, Samantha R, PA-C  ibuprofen (ADVIL,MOTRIN) 800 MG tablet Take 1 tablet (800 mg total) by mouth every 8 (eight) hours as needed. 08/27/17   Petrucelli, Samantha R, PA-C  naproxen (NAPROSYN) 500 MG tablet Take 1 tablet (500 mg total) by mouth 2 (two) times daily with a meal. 03/20/16   Baird Cancer, Vilinda Blanks, PA-C  omeprazole (PRILOSEC) 20 MG capsule Take 1 capsule (20 mg total) by mouth 2 (two) times daily before a meal for 14 days. 04/25/17 05/09/17  Rodell Perna A, PA-C  penicillin v potassium (VEETID) 500 MG tablet Take 1 tablet (500 mg total) by mouth 4 (four) times daily. 03/20/16   Larene Pickett, PA-C  sucralfate (CARAFATE) 1 g tablet Take 1 tablet (1 g total) by mouth 4 (four) times daily -  with meals and at bedtime for 14 days. 04/25/17 05/09/17  Rodell Perna A, PA-C      Allergies    Patient has no known allergies.    Review of  Systems   Review of Systems  Physical Exam Updated Vital Signs BP 134/86   Pulse (!) 119   Temp 98.6 F (37 C) (Oral)   Resp 18   Ht 5\' 7"  (1.702 m)   Wt 101.4 kg   SpO2 93%   BMI 35.01 kg/m  Physical Exam Vitals and nursing note reviewed.  Constitutional:      General: She is not in acute distress.    Appearance: She is well-developed.  HENT:     Head: Normocephalic and atraumatic.  Eyes:     Conjunctiva/sclera: Conjunctivae normal.  Cardiovascular:     Rate and Rhythm: Normal rate and regular rhythm.     Pulses: Normal pulses.     Heart sounds: No murmur heard. Pulmonary:     Effort: Pulmonary effort is normal. No respiratory distress.     Breath sounds: Normal breath sounds.  Abdominal:     Palpations: Abdomen is soft.     Tenderness: There is no abdominal tenderness.  Musculoskeletal:        General: Swelling and tenderness present.     Cervical back: Neck supple.     Comments: Mild tenderness in mild swelling to the top of the right foot on the lateral portion, there is normal range of motion at the ankle and  otherwise throughout the right lower extremity, no calf tenderness, no swelling of the upper leg  Skin:    General: Skin is warm and dry.     Capillary Refill: Capillary refill takes less than 2 seconds.     Findings: Erythema present.  Neurological:     General: No focal deficit present.     Mental Status: She is alert.     Sensory: No sensory deficit.     Motor: No weakness.  Psychiatric:        Mood and Affect: Mood normal.     ED Results / Procedures / Treatments   Labs (all labs ordered are listed, but only abnormal results are displayed) Labs Reviewed - No data to display  EKG None  Radiology DG Foot Complete Right  Result Date: 06/17/2022 CLINICAL DATA:  Pain and swelling for 3 days.  No injury. EXAM: RIGHT FOOT COMPLETE - 3+ VIEW COMPARISON:  None Available. FINDINGS: There is no evidence of fracture or dislocation. There is no evidence  of arthropathy or other focal bone abnormality. Soft tissues are unremarkable. IMPRESSION: Negative. Electronically Signed   By: Dorise Bullion III M.D.   On: 06/17/2022 08:08    Procedures Procedures    Medications Ordered in ED Medications - No data to display  ED Course/ Medical Decision Making/ A&P                             Medical Decision Making Amount and/or Complexity of Data Reviewed Radiology: ordered.  Risk Prescription drug management.   [facility] is here with right foot pain.  No significant comorbidities.  Differential diagnosis likely mild cellulitis versus an inflammatory arthritis process.  Does not seem to be likely to be gout given the location of the tenderness and swelling.  Tenderness and swelling to the top of the right foot on the lateral portion in the midfoot.  I have no concern for septic joint.  She is able to range the ankle without any discomfort.  X-ray was obtained and per my review and interpretation shows no obvious fracture or malalignment.  Overall she is neurovascularly neuromuscular intact.  Have no concern for peripheral arterial process.  No concern for DVT.  Will recommend Tylenol, ibuprofen, ice and cam walking boot as this could be inflammatory process.  Will give antibiotic course to treat for possible cellulitis.  Discharged in good condition.  Recommend follow-up with primary care and podiatry.  This chart was dictated using voice recognition software.  Despite best efforts to proofread,  errors can occur which can change the documentation meaning.         Final Clinical Impression(s) / ED Diagnoses Final diagnoses:  Foot pain, right    Rx / DC Orders ED Discharge Orders          Ordered    cephALEXin (KEFLEX) 500 MG capsule  3 times daily        06/17/22 0825              Lennice Sites, DO 06/17/22 0827

## 2022-06-17 NOTE — Discharge Instructions (Signed)
Recommend 1000 g of Tylenol every 6 hours as needed for pain, recommend 800 mg ibuprofen every 8 hours as needed for pain.  Recommend ice.  Use walking boot for comfort.  Take antibiotics as prescribed as well.  My suspicion is that this is likely an inflammatory process but there could be a small skin infection in this area as well that I recommend that you take antibiotic.  Follow-up with primary care doctor or podiatrist.  Return if symptoms worsen.

## 2022-06-17 NOTE — ED Triage Notes (Signed)
Pt arrives pov, slow gait, c/o RT foot pain and swelling x 3 days. Pt denies injury, endorses pain not responding to otc meds

## 2022-06-17 NOTE — ED Notes (Signed)
Pt discharged to home. Discharge instructions have been discussed with patient and/or family members. Pt verbally acknowledges understanding d/c instructions, and endorses comprehension to checkout at registration before leaving.  °

## 2023-10-09 ENCOUNTER — Other Ambulatory Visit: Payer: Self-pay | Admitting: Orthopedic Surgery

## 2023-10-09 ENCOUNTER — Encounter (HOSPITAL_BASED_OUTPATIENT_CLINIC_OR_DEPARTMENT_OTHER): Payer: Self-pay | Admitting: Orthopedic Surgery

## 2023-10-09 ENCOUNTER — Other Ambulatory Visit: Payer: Self-pay

## 2023-10-10 NOTE — Anesthesia Preprocedure Evaluation (Signed)
 Anesthesia Evaluation  Patient identified by MRN, date of birth, ID band Patient awake    Reviewed: Allergy & Precautions, NPO status , Patient's Chart, lab work & pertinent test results  History of Anesthesia Complications Negative for: history of anesthetic complications  Airway Mallampati: II  TM Distance: >3 FB Neck ROM: Full    Dental  (+) Edentulous Lower, Edentulous Upper   Pulmonary Current Smoker   Pulmonary exam normal        Cardiovascular negative cardio ROS Normal cardiovascular exam     Neuro/Psych negative neurological ROS     GI/Hepatic Neg liver ROS,GERD  ,,  Endo/Other  negative endocrine ROS    Renal/GU negative Renal ROS  negative genitourinary   Musculoskeletal Closed displaced fracture of proximal phalanx of right great toe   Abdominal   Peds  Hematology negative hematology ROS (+)   Anesthesia Other Findings Day of surgery medications reviewed with patient.  Reproductive/Obstetrics                              Anesthesia Physical Anesthesia Plan  ASA: 2  Anesthesia Plan: General   Post-op Pain Management: Tylenol PO (pre-op)*   Induction: Intravenous  PONV Risk Score and Plan: 3 and Treatment may vary due to age or medical condition, Ondansetron, Dexamethasone and Midazolam  Airway Management Planned: LMA  Additional Equipment: None  Intra-op Plan:   Post-operative Plan: Extubation in OR  Informed Consent: I have reviewed the patients History and Physical, chart, labs and discussed the procedure including the risks, benefits and alternatives for the proposed anesthesia with the patient or authorized representative who has indicated his/her understanding and acceptance.     Dental advisory given  Plan Discussed with: CRNA  Anesthesia Plan Comments:         Anesthesia Lieb Evaluation

## 2023-10-11 ENCOUNTER — Other Ambulatory Visit: Payer: Self-pay

## 2023-10-11 ENCOUNTER — Ambulatory Visit (HOSPITAL_BASED_OUTPATIENT_CLINIC_OR_DEPARTMENT_OTHER): Payer: Worker's Compensation | Admitting: Anesthesiology

## 2023-10-11 ENCOUNTER — Encounter (HOSPITAL_BASED_OUTPATIENT_CLINIC_OR_DEPARTMENT_OTHER): Payer: Self-pay | Admitting: Orthopedic Surgery

## 2023-10-11 ENCOUNTER — Ambulatory Visit (HOSPITAL_BASED_OUTPATIENT_CLINIC_OR_DEPARTMENT_OTHER): Payer: Worker's Compensation

## 2023-10-11 ENCOUNTER — Encounter (HOSPITAL_BASED_OUTPATIENT_CLINIC_OR_DEPARTMENT_OTHER)
Admission: RE | Disposition: A | Discharge status: Home / Self Care | Payer: Self-pay | Source: Home / Self Care | Attending: Orthopedic Surgery

## 2023-10-11 ENCOUNTER — Ambulatory Visit (HOSPITAL_BASED_OUTPATIENT_CLINIC_OR_DEPARTMENT_OTHER)
Admission: RE | Admit: 2023-10-11 | Discharge: 2023-10-11 | Disposition: A | Discharge status: Home / Self Care | Payer: Worker's Compensation | Attending: Orthopedic Surgery | Admitting: Orthopedic Surgery

## 2023-10-11 DIAGNOSIS — W010XXA Fall on same level from slipping, tripping and stumbling without subsequent striking against object, initial encounter: Secondary | ICD-10-CM | POA: Insufficient documentation

## 2023-10-11 DIAGNOSIS — S92411A Displaced fracture of proximal phalanx of right great toe, initial encounter for closed fracture: Secondary | ICD-10-CM

## 2023-10-11 DIAGNOSIS — F17210 Nicotine dependence, cigarettes, uncomplicated: Secondary | ICD-10-CM | POA: Diagnosis not present

## 2023-10-11 DIAGNOSIS — Z01818 Encounter for other preprocedural examination: Secondary | ICD-10-CM

## 2023-10-11 HISTORY — DX: Gastro-esophageal reflux disease without esophagitis: K21.9

## 2023-10-11 HISTORY — PX: ORIF TOE FRACTURE: SHX5032

## 2023-10-11 SURGERY — OPEN REDUCTION INTERNAL FIXATION (ORIF) METATARSAL (TOE) FRACTURE
Anesthesia: General | Site: Foot | Laterality: Right

## 2023-10-11 MED ORDER — DEXAMETHASONE SODIUM PHOSPHATE 10 MG/ML IJ SOLN
INTRAMUSCULAR | Status: DC | PRN
  Administered 2023-10-11: 5 mg via INTRAVENOUS

## 2023-10-11 MED ORDER — LACTATED RINGERS IV SOLN: INTRAVENOUS | Status: DC | PRN

## 2023-10-11 MED ORDER — OXYCODONE HCL 5 MG PO TABS: 5.0000 mg | ORAL_TABLET | Freq: Once | ORAL | Status: DC | PRN

## 2023-10-11 MED ORDER — PROPOFOL 10 MG/ML IV BOLUS
INTRAVENOUS | Status: DC | PRN
  Administered 2023-10-11: 200 mg via INTRAVENOUS

## 2023-10-11 MED ORDER — FENTANYL CITRATE (PF) 100 MCG/2ML IJ SOLN
INTRAMUSCULAR | Status: AC
  Filled 2023-10-11: qty 2

## 2023-10-11 MED ORDER — FENTANYL CITRATE (PF) 100 MCG/2ML IJ SOLN
INTRAMUSCULAR | Status: DC | PRN
  Administered 2023-10-11 (×2): 50 ug via INTRAVENOUS
  Administered 2023-10-11: 100 ug via INTRAVENOUS

## 2023-10-11 MED ORDER — LIDOCAINE 2% (20 MG/ML) 5 ML SYRINGE
INTRAMUSCULAR | Status: AC
  Filled 2023-10-11: qty 5

## 2023-10-11 MED ORDER — SENNA 8.6 MG PO TABS: 2.0000 | ORAL_TABLET | Freq: Two times a day (BID) | ORAL | 0 refills | Status: AC

## 2023-10-11 MED ORDER — OXYCODONE HCL 5 MG PO TABS: 5.0000 mg | ORAL_TABLET | Freq: Four times a day (QID) | ORAL | 0 refills | Status: AC | PRN

## 2023-10-11 MED ORDER — 0.9 % SODIUM CHLORIDE (POUR BTL) OPTIME
TOPICAL | Status: DC | PRN
  Administered 2023-10-11: 100 mL

## 2023-10-11 MED ORDER — CEFAZOLIN SODIUM-DEXTROSE 2-4 GM/100ML-% IV SOLN
2.0000 g | INTRAVENOUS | Status: DC
Start: 2023-10-11 — End: 2023-10-11

## 2023-10-11 MED ORDER — MIDAZOLAM HCL 2 MG/2ML IJ SOLN
INTRAMUSCULAR | Status: DC | PRN
  Administered 2023-10-11: 2 mg via INTRAVENOUS

## 2023-10-11 MED ORDER — MIDAZOLAM HCL 2 MG/2ML IJ SOLN
INTRAMUSCULAR | Status: AC
  Filled 2023-10-11: qty 2

## 2023-10-11 MED ORDER — BUPIVACAINE-EPINEPHRINE 0.5% -1:200000 IJ SOLN
INTRAMUSCULAR | Status: DC | PRN
  Administered 2023-10-11: 10 mL

## 2023-10-11 MED ORDER — ACETAMINOPHEN 500 MG PO TABS
1000.0000 mg | ORAL_TABLET | Freq: Once | ORAL | Status: AC
  Administered 2023-10-11: 1000 mg via ORAL

## 2023-10-11 MED ORDER — CEFAZOLIN SODIUM-DEXTROSE 2-4 GM/100ML-% IV SOLN
INTRAVENOUS | Status: AC
  Filled 2023-10-11: qty 100

## 2023-10-11 MED ORDER — ONDANSETRON HCL 4 MG/2ML IJ SOLN
INTRAMUSCULAR | Status: DC | PRN
  Administered 2023-10-11: 4 mg via INTRAVENOUS

## 2023-10-11 MED ORDER — ACETAMINOPHEN 500 MG PO TABS
ORAL_TABLET | ORAL | Status: AC
  Filled 2023-10-11: qty 2

## 2023-10-11 MED ORDER — CEFAZOLIN SODIUM-DEXTROSE 2-3 GM-%(50ML) IV SOLR
INTRAVENOUS | Status: DC | PRN
  Administered 2023-10-11: 2 g via INTRAVENOUS

## 2023-10-11 MED ORDER — FENTANYL CITRATE (PF) 100 MCG/2ML IJ SOLN: 25.0000 ug | INTRAMUSCULAR | Status: DC | PRN

## 2023-10-11 MED ORDER — DOCUSATE SODIUM 100 MG PO CAPS: 100.0000 mg | ORAL_CAPSULE | Freq: Two times a day (BID) | ORAL | 0 refills | Status: AC

## 2023-10-11 MED ORDER — LIDOCAINE HCL (CARDIAC) PF 100 MG/5ML IV SOSY
PREFILLED_SYRINGE | INTRAVENOUS | Status: DC | PRN
  Administered 2023-10-11: 100 mg via INTRAVENOUS

## 2023-10-11 MED ORDER — PROPOFOL 10 MG/ML IV BOLUS
INTRAVENOUS | Status: AC
  Filled 2023-10-11: qty 20

## 2023-10-11 MED ORDER — OXYCODONE HCL 5 MG/5ML PO SOLN: 5.0000 mg | Freq: Once | ORAL | Status: DC | PRN

## 2023-10-11 MED ORDER — LACTATED RINGERS IV SOLN: INTRAVENOUS | Status: DC

## 2023-10-11 MED ORDER — DROPERIDOL 2.5 MG/ML IJ SOLN: 0.6250 mg | Freq: Once | INTRAMUSCULAR | Status: DC | PRN

## 2023-10-11 SURGICAL SUPPLY — 55 items
BANDAGE ESMARK 6X9 LF (GAUZE/BANDAGES/DRESSINGS) IMPLANT
BLADE SURG 15 STRL LF DISP TIS (BLADE) ×2 IMPLANT
BNDG ELASTIC 4INX 5YD STR LF (GAUZE/BANDAGES/DRESSINGS) ×1 IMPLANT
BNDG ELASTIC 6INX 5YD STR LF (GAUZE/BANDAGES/DRESSINGS) IMPLANT
BNDG ESMARK 4X9 LF (GAUZE/BANDAGES/DRESSINGS) IMPLANT
BOOT STEPPER DURA LG (SOFTGOODS) IMPLANT
BOOT STEPPER DURA MED (SOFTGOODS) IMPLANT
CANISTER SUCT 1200ML W/VALVE (MISCELLANEOUS) ×1 IMPLANT
CHLORAPREP W/TINT 26 (MISCELLANEOUS) ×1 IMPLANT
COVER BACK TABLE 60X90IN (DRAPES) ×1 IMPLANT
CUFF TRNQT CYL 34X4.125X (TOURNIQUET CUFF) IMPLANT
DRAPE EXTREMITY T 121X128X90 (DISPOSABLE) ×1 IMPLANT
DRAPE OEC MINIVIEW 54X84 (DRAPES) ×1 IMPLANT
DRAPE U-SHAPE 47X51 STRL (DRAPES) ×1 IMPLANT
DRSG MEPITEL 4X7.2 (GAUZE/BANDAGES/DRESSINGS) ×1 IMPLANT
ELECTRODE REM PT RTRN 9FT ADLT (ELECTROSURGICAL) ×1 IMPLANT
GAUZE PAD ABD 8X10 STRL (GAUZE/BANDAGES/DRESSINGS) ×2 IMPLANT
GAUZE SPONGE 4X4 12PLY STRL (GAUZE/BANDAGES/DRESSINGS) ×1 IMPLANT
GAUZE STRETCH 2X75IN STRL (MISCELLANEOUS) ×1 IMPLANT
GLOVE BIO SURGEON STRL SZ8 (GLOVE) ×1 IMPLANT
GLOVE BIOGEL PI IND STRL 7.0 (GLOVE) IMPLANT
GLOVE BIOGEL PI IND STRL 8 (GLOVE) ×2 IMPLANT
GLOVE ECLIPSE 8.0 STRL XLNG CF (GLOVE) ×1 IMPLANT
GLOVE INDICATOR 7.5 STRL GRN (GLOVE) IMPLANT
GLOVE SURG SS PI 7.0 STRL IVOR (GLOVE) IMPLANT
GOWN STRL REUS W/ TWL LRG LVL3 (GOWN DISPOSABLE) ×1 IMPLANT
GOWN STRL REUS W/ TWL XL LVL3 (GOWN DISPOSABLE) ×2 IMPLANT
KWIRE SNGL .054X4 NSTRL (WIRE) IMPLANT
MAT PREVALON FULL STRYKER (MISCELLANEOUS) IMPLANT
NDL HYPO 22X1.5 SAFETY MO (MISCELLANEOUS) IMPLANT
NEEDLE HYPO 22X1.5 SAFETY MO (MISCELLANEOUS) IMPLANT
NS IRRIG 1000ML POUR BTL (IV SOLUTION) ×1 IMPLANT
PACK BASIN DAY SURGERY FS (CUSTOM PROCEDURE TRAY) ×1 IMPLANT
PAD CAST 4YDX4 CTTN HI CHSV (CAST SUPPLIES) ×1 IMPLANT
PADDING CAST ABS COTTON 4X4 ST (CAST SUPPLIES) IMPLANT
PADDING CAST COTTON 6X4 STRL (CAST SUPPLIES) IMPLANT
PENCIL SMOKE EVACUATOR (MISCELLANEOUS) ×1 IMPLANT
SANITIZER HAND PURELL FF 515ML (MISCELLANEOUS) ×1 IMPLANT
SHEET MEDIUM DRAPE 40X70 STRL (DRAPES) ×1 IMPLANT
SLEEVE SCD COMPRESS KNEE MED (STOCKING) ×1 IMPLANT
SPIKE FLUID TRANSFER (MISCELLANEOUS) IMPLANT
SPONGE T-LAP 18X18 ~~LOC~~+RFID (SPONGE) ×1 IMPLANT
STOCKINETTE 6 STRL (DRAPES) ×1 IMPLANT
STRIP CLOSURE SKIN 1/2X4 (GAUZE/BANDAGES/DRESSINGS) IMPLANT
SUCTION TUBE FRAZIER 10FR DISP (SUCTIONS) ×1 IMPLANT
SUT ETHILON 3 0 PS 1 (SUTURE) ×1 IMPLANT
SUT MNCRL AB 3-0 PS2 18 (SUTURE) IMPLANT
SUT VIC AB 2-0 SH 27XBRD (SUTURE) IMPLANT
SUT VICRYL 0 SH 27 (SUTURE) IMPLANT
SUTURE FIBERWR #2 38 T-5 BLUE (SUTURE) IMPLANT
SYR BULB EAR ULCER 3OZ GRN STR (SYRINGE) ×1 IMPLANT
SYR CONTROL 10ML LL (SYRINGE) IMPLANT
TOWEL GREEN STERILE FF (TOWEL DISPOSABLE) ×2 IMPLANT
TUBE CONNECTING 20X1/4 (TUBING) ×1 IMPLANT
UNDERPAD 30X36 HEAVY ABSORB (UNDERPADS AND DIAPERS) ×1 IMPLANT

## 2023-10-11 NOTE — Transfer of Care (Signed)
 Immediate Anesthesia Transfer of Care Note  Patient: [facility]  Procedure(s) Performed: Open reduction internal fixation right hallux fracture (Right: Foot)  Patient Location: PACU  Anesthesia Type:General  Level of Consciousness: awake, alert , and oriented  Airway & Oxygen Therapy: Patient Spontanous Breathing and Patient connected to face mask oxygen  Post-op Assessment: Report given to RN and Post -op Vital signs reviewed and stable  Post vital signs: Reviewed and stable  Last Vitals:  Vitals Value Taken Time  BP    Temp    Pulse 83 10/11/23 1206  Resp 15 10/11/23 1206  SpO2 93 % 10/11/23 1206  Vitals shown include unfiled device data.  Last Pain:  Vitals:   10/11/23 0923  TempSrc: Temporal  PainSc: 0-No pain      Patients Stated Pain Goal: 2 (10/11/23 7829)  Complications: No notable events documented.

## 2023-10-11 NOTE — Anesthesia Procedure Notes (Signed)
 Procedure Name: LMA Insertion Date/Time: 10/11/2023 11:51 AM  Performed by: Raymona Caldwell, CRNAPre-anesthesia Checklist: Patient identified, Emergency Drugs available, Suction available and Patient being monitored Patient Re-evaluated:Patient Re-evaluated prior to induction Oxygen Delivery Method: Circle system utilized Preoxygenation: Pre-oxygenation with 100% oxygen Induction Type: IV induction Ventilation: Mask ventilation without difficulty LMA: LMA inserted LMA Size: 4.0 Number of attempts: 1 Airway Equipment and Method: Bite block Placement Confirmation: positive ETCO2, breath sounds checked- equal and bilateral and CO2 detector Tube secured with: Tape Dental Injury: Teeth and Oropharynx as per pre-operative assessment

## 2023-10-11 NOTE — Op Note (Signed)
 10/11/2023  12:01 PM  PATIENT:  [facility]  58 y.o. female  PRE-OPERATIVE DIAGNOSIS: Right hallux displaced proximal phalanx fracture  POST-OPERATIVE DIAGNOSIS: Same  Procedure(s): 1.  Open treatment right hallux proximal phalanx fracture with internal fixation 2.  AP and lateral radiographs of the right foot  SURGEON:  Amada Backer, MD  ASSISTANT: Adelfa Adolph, PA-C  ANESTHESIA:   General, local  EBL:  minimal   TOURNIQUET: None  COMPLICATIONS:  None apparent  DISPOSITION:  Extubated, awake and stable to recovery.  INDICATION FOR PROCEDURE: 58 year old female with a past medical history significant for nicotine use complains of right foot pain since an injury at work approximately 2 weeks ago.  She describes a plantarflexion injury of the ankle and foot when she slipped on some water.  Radiographs reveal a comminuted and displaced fracture of the proximal phalanx.  She presents today for surgical treatment. The risks and benefits of the alternative treatment options have been discussed in detail.  The patient wishes to proceed with surgery and specifically understands risks of bleeding, infection, nerve damage, blood clots, need for additional surgery, amputation and death.   PROCEDURE IN DETAIL:  After pre operative consent was obtained, and the correct operative site was identified, the patient was brought to the operating room and placed supine on the OR table.  Anesthesia was administered.  Pre-operative antibiotics were administered.  A surgical timeout was taken.  The right lower extremity was prepped and draped in standard sterile fashion.  An AP radiograph was obtained.  The fracture site was identified.  An attempted closed reduction was made but was unsuccessful.  A small incision was made dorsal medial adjacent to the primary fracture line.  A small elevator was inserted dorsally and plantarly to the fracture site freeing up all surrounding soft tissue.  The elevator was then  inserted into the primary fracture line and was used to mobilize the fracture and reduce the impacted dorsal fragment.  Longitudinal traction and plantarflexion was then applied to the fracture fragment.  It was noted to be adequately reduced on AP and lateral radiographs.  A 0.054 K wire was then inserted from the distal medial corner adjacent to the IP joint.  It was advanced obliquely across the fracture line to the lateral base of the proximal phalanx where it engaged the subchondral bone.  A second guidepin was inserted from the plantar medial base of the proximal phalanx and advanced across obliquely to lateral cortex distal to the fracture line.  AP and lateral radiographs confirmed appropriate reduction of the fracture in appropriate position and length of both guidepins.  Both pins were then bent, trimmed and capped.  The incision was irrigated and closed with Steri-Strips.  Sterile dressings were applied followed by a compression dressing and a flat postop shoe.  The patient was awakened from anesthesia and transported to the recovery room in stable condition.  FOLLOW UP PLAN: Weightbearing as tolerated in the flat postop shoe.  Follow-up in the office in 2 weeks for a wound check and dressing change.  Plan to pull the pins 6 weeks postoperatively.  No indication for DVT prophylaxis in this ambulatory patient.   RADIOGRAPHS: AP and lateral radiographs of the right foot are obtained intraoperatively.  These show interval reduction and fixation of the hallux proximal phalanx fracture.  Hardware is appropriately positioned and of the appropriate lengths.  No other acute injuries are noted.    Justin Ollis PA-C was present and scrubbed for the duration  of the operative case. His assistance was essential in positioning the patient, prepping and draping, gaining and maintaining exposure, performing the operation, closing and dressing the wounds and applying the splint.

## 2023-10-11 NOTE — H&P (Signed)
 [facility] is an 58 y.o. female.   Chief Complaint: right foot pain HPI: 58 y/o female with PMH of smoking c/o right foot pain since a fall at work over a week ago.  She has a displaced fracture of the right hallux proximal phalanx and presents today for surgical treatment.  Past Medical History:  Diagnosis Date   Ectopic pregnancy    GERD (gastroesophageal reflux disease)    resolved    Past Surgical History:  Procedure Laterality Date   ECTOPIC PREGNANCY SURGERY      History reviewed. No pertinent family history. Social History:  reports that she has been smoking cigarettes. She has never used smokeless tobacco. She reports current alcohol use. She reports that she does not use drugs.  Allergies: No Known Allergies  Medications Prior to Admission  Medication Sig Dispense Refill   ibuprofen  (ADVIL ,MOTRIN ) 800 MG tablet Take 1 tablet (800 mg total) by mouth every 8 (eight) hours as needed. 30 tablet 0    No results found for this or any previous visit (from the past 48 hours). No results found.  Review of Systems  no recent f/c/n/v/wt loss  Height 5\' 6"  (1.676 m), weight 106.6 kg. Physical Exam  Wn wd woman in nad.  A and O.  EOMI.  Resp unlabored.  Right foot with swelling at the hallux.  Healthy skin.  Intact sens to LT at the dorsal and plantar hallux.  No lymphadenopathy.  TTP at the hallux.     Assessment/Plan R hallux proximal phalanx displaced fracture - to the OR today for ORIF.  The risks and benefits of the alternative treatment options have been discussed in detail.  The patient wishes to proceed with surgery and specifically understands risks of bleeding, infection, nerve damage, blood clots, need for additional surgery, amputation and death.   Amada Backer, MD 29-Oct-2023, 8:56 AM

## 2023-10-11 NOTE — Anesthesia Postprocedure Evaluation (Signed)
 Anesthesia Post Note  Patient: Ashley Potter  Procedure(s) Performed: Open reduction internal fixation right hallux fracture (Right: Foot)     Patient location during evaluation: PACU Anesthesia Type: General Level of consciousness: awake and alert Pain management: pain level controlled Vital Signs Assessment: post-procedure vital signs reviewed and stable Respiratory status: spontaneous breathing, nonlabored ventilation and respiratory function stable Cardiovascular status: blood pressure returned to baseline Postop Assessment: no apparent nausea or vomiting Anesthetic complications: no   No notable events documented.  Last Vitals:  Vitals:   10/11/23 1220 10/11/23 1236  BP: (!) 153/83 (!) 149/98  Pulse: 73 67  Resp: 15 15  Temp:  36.9 C  SpO2: 94% 92%    Last Pain:  Vitals:   10/11/23 1236  TempSrc:   PainSc: 0-No pain        RLE Motor Response: Purposeful movement;Responds to commands (10/11/23 1236) RLE Sensation: Numbness;Decreased (10/11/23 1236)      Rayfield Cairo

## 2023-10-11 NOTE — Discharge Instructions (Addendum)
 Amada Backer, MD [facility]  Please read the following information regarding your care after surgery.  Medications  You only need a prescription for the narcotic pain medicine (ex. oxycodone, Percocet, Norco).  All of the other medicines listed below are available over the counter. ? Aleve  2 pills twice a day for the first 3 days after surgery. ? acetominophen (Tylenol) 650 mg every 4-6 hours as you need for minor to moderate pain ? oxycodone as prescribed for severe pain  Narcotic pain medicine (ex. oxycodone, Percocet, Vicodin) will cause constipation.  To prevent this problem, take the following medicines while you are taking any pain medicine. ? docusate sodium (Colace) 100 mg twice a day ? senna (Senokot) 2 tablets twice a day  Weight Bearing ? Bear weight only on your operated foot in the post-op shoe.   Cast / Splint / Dressing ? Keep your splint, cast or dressing clean and dry.  Don't put anything (coat hanger, pencil, etc) down inside of it.  If it gets damp, use a hair dryer on the cool setting to dry it.  If it gets soaked, call the office to schedule an appointment for a cast change.   After your dressing, cast or splint is removed; you may shower, but do not soak or scrub the wound.  Allow the water to run over it, and then gently pat it dry.  Swelling It is normal for you to have swelling where you had surgery.  To reduce swelling and pain, keep your toes above your nose for at least 3 days after surgery.  It may be necessary to keep your foot or leg elevated for several weeks.  If it hurts, it should be elevated.  Follow Up Call my office at [phone] when you are discharged from the hospital or surgery center to schedule an appointment to be seen two weeks after surgery.  Call my office at [phone] if you develop a fever >101.5 F, nausea, vomiting, bleeding from the surgical site or severe pain.     Post Anesthesia Home Care Instructions  Activity: Get  plenty of rest for the remainder of the day. A responsible individual must stay with you for 24 hours following the procedure.  For the next 24 hours, DO NOT: -Drive a car -Advertising copywriter -Drink alcoholic beverages -Take any medication unless instructed by your physician -Make any legal decisions or sign important papers.  Meals: Start with liquid foods such as gelatin or soup. Progress to regular foods as tolerated. Avoid greasy, spicy, heavy foods. If nausea and/or vomiting occur, drink only clear liquids until the nausea and/or vomiting subsides. Call your physician if vomiting continues.  Special Instructions/Symptoms: Your throat may feel dry or sore from the anesthesia or the breathing tube placed in your throat during surgery. If this causes discomfort, gargle with warm salt water. The discomfort should disappear within 24 hours.  Tylenol can be taken again at 330pm if needed.

## 2023-10-12 ENCOUNTER — Encounter (HOSPITAL_BASED_OUTPATIENT_CLINIC_OR_DEPARTMENT_OTHER): Payer: Self-pay | Admitting: Orthopedic Surgery
